# Patient Record
Sex: Female | Born: 2000 | Race: Black or African American | Hispanic: No | Smoking: Former smoker
Health system: Southern US, Community
[De-identification: ages and names within clinical notes are randomized; demographics above are authoritative.]

## PROBLEM LIST (undated history)

## (undated) DIAGNOSIS — D649 Anemia, unspecified: Secondary | ICD-10-CM

## (undated) HISTORY — DX: Anemia, unspecified: D64.9

## (undated) HISTORY — PX: OTHER SURGICAL HISTORY: SHX169

---
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
## 2022-02-07 ENCOUNTER — Ambulatory Visit: Payer: Medicaid Other

## 2022-02-07 ENCOUNTER — Ambulatory Visit (LOCAL_COMMUNITY_HEALTH_CENTER): Payer: Medicaid Other

## 2022-02-07 DIAGNOSIS — Z3201 Encounter for pregnancy test, result positive: Secondary | ICD-10-CM | POA: Diagnosis not present

## 2022-02-07 LAB — PREGNANCY, URINE: Preg Test, Ur: POSITIVE — AB

## 2022-02-07 NOTE — Progress Notes (Signed)
Patient did not report to nurse clinic after going to the lab. Nurse checked both small and big waiting room several times.  Confirmed with Lab she was seen and obtained results of Positive Pregnancy Test.   Attempted to call patient by phone x2.  Information booth attempted to call by phone as well.   No answer.   Also overhead page patient, and patient did not report to clinic.     Jettson Crable Sherrilyn Rist, RN

## 2022-02-25 ENCOUNTER — Other Ambulatory Visit: Payer: Self-pay

## 2022-02-25 ENCOUNTER — Encounter: Payer: Self-pay | Admitting: Advanced Practice Midwife

## 2022-02-25 ENCOUNTER — Ambulatory Visit: Payer: Medicaid Other | Admitting: Advanced Practice Midwife

## 2022-02-25 VITALS — BP 105/74 | HR 98 | Temp 97.6°F | Wt 159.4 lb

## 2022-02-25 DIAGNOSIS — U071 COVID-19: Secondary | ICD-10-CM | POA: Insufficient documentation

## 2022-02-25 DIAGNOSIS — A599 Trichomoniasis, unspecified: Secondary | ICD-10-CM | POA: Insufficient documentation

## 2022-02-25 DIAGNOSIS — F1290 Cannabis use, unspecified, uncomplicated: Secondary | ICD-10-CM

## 2022-02-25 DIAGNOSIS — Z720 Tobacco use: Secondary | ICD-10-CM | POA: Insufficient documentation

## 2022-02-25 DIAGNOSIS — O0930 Supervision of pregnancy with insufficient antenatal care, unspecified trimester: Secondary | ICD-10-CM

## 2022-02-25 DIAGNOSIS — Z3402 Encounter for supervision of normal first pregnancy, second trimester: Secondary | ICD-10-CM | POA: Insufficient documentation

## 2022-02-25 DIAGNOSIS — O99312 Alcohol use complicating pregnancy, second trimester: Secondary | ICD-10-CM | POA: Diagnosis not present

## 2022-02-25 DIAGNOSIS — O0932 Supervision of pregnancy with insufficient antenatal care, second trimester: Secondary | ICD-10-CM | POA: Diagnosis not present

## 2022-02-25 DIAGNOSIS — O99310 Alcohol use complicating pregnancy, unspecified trimester: Secondary | ICD-10-CM | POA: Insufficient documentation

## 2022-02-25 HISTORY — DX: Alcohol use complicating pregnancy, unspecified trimester: O99.310

## 2022-02-25 HISTORY — DX: Trichomoniasis, unspecified: A59.9

## 2022-02-25 HISTORY — DX: Encounter for supervision of normal first pregnancy, second trimester: Z34.02

## 2022-02-25 HISTORY — DX: Cannabis use, unspecified, uncomplicated: F12.90

## 2022-02-25 HISTORY — DX: Supervision of pregnancy with insufficient antenatal care, unspecified trimester: O09.30

## 2022-02-25 HISTORY — DX: Tobacco use: Z72.0

## 2022-02-25 LAB — URINALYSIS
Bilirubin, UA: NEGATIVE
Glucose, UA: NEGATIVE
Ketones, UA: NEGATIVE
Nitrite, UA: NEGATIVE
Specific Gravity, UA: 1.025 (ref 1.005–1.030)
Urobilinogen, Ur: 0.2 mg/dL (ref 0.2–1.0)
pH, UA: 6.5 (ref 5.0–7.5)

## 2022-02-25 LAB — WET PREP FOR TRICH, YEAST, CLUE
Trichomonas Exam: POSITIVE — AB
Yeast Exam: NEGATIVE

## 2022-02-25 LAB — HEMOGLOBIN, FINGERSTICK: Hemoglobin: 11.2 g/dL (ref 11.1–15.9)

## 2022-02-25 MED ORDER — METRONIDAZOLE 500 MG PO TABS: 500.0000 mg | ORAL_TABLET | Freq: Two times a day (BID) | ORAL | 0 refills | Status: AC

## 2022-02-25 MED ORDER — PRENATAL VITAMIN 27-0.8 MG PO TABS: 1.0000 | ORAL_TABLET | Freq: Every day | ORAL | 0 refills | Status: DC

## 2022-02-26 LAB — 789231 7+OXYCODONE-BUND
Amphetamines, Urine: NEGATIVE ng/mL
BENZODIAZ UR QL: NEGATIVE ng/mL
Barbiturate screen, urine: NEGATIVE ng/mL
Cannabinoid Quant, Ur: NEGATIVE ng/mL
Cocaine (Metab.): NEGATIVE ng/mL
OPIATE SCREEN URINE: NEGATIVE ng/mL
Oxycodone/Oxymorphone, Urine: NEGATIVE ng/mL
PCP Quant, Ur: NEGATIVE ng/mL

## 2022-02-27 ENCOUNTER — Other Ambulatory Visit: Payer: Medicaid Other

## 2022-02-27 DIAGNOSIS — Z3402 Encounter for supervision of normal first pregnancy, second trimester: Secondary | ICD-10-CM

## 2022-02-27 LAB — CHLAMYDIA/GC NAA, CONFIRMATION
Chlamydia trachomatis, NAA: NEGATIVE
Neisseria gonorrhoeae, NAA: NEGATIVE

## 2022-02-27 LAB — URINE CULTURE

## 2022-02-27 NOTE — Progress Notes (Signed)
In Nurse Clinic for 1 hr GTT. Drank glucose drink in less than one minute and finished bottle at 8:30 am. Instructions given for test today and to remain npo til lab draw at 9:30 am. Advised to notify RN if n/v. Lab notified that lab draw to take place at 9:30 am.   Pt declines Tdap today.   Pt's mother asks about U/S referral. RN spoke with [facility]  RN Marijo Conception, RN)  who confirms that [facility] has received fax for U/S referral and that pt should be expecting call from [facility] to schedule. RN explained this to pt and her mother. Questions answered and reports understanding. Next [facility] RV appt 03/11/22 arrival 10 am, pt aware. Jerel Shepherd, RN

## 2022-02-28 LAB — GLUCOSE, 1 HOUR GESTATIONAL: Gestational Diabetes Screen: 114 mg/dL (ref 70–139)

## 2022-03-02 LAB — CBC/D/PLT+RPR+RH+ABO+AB SCR
Antibody Screen: NEGATIVE
Basophils Absolute: 0 10*3/uL (ref 0.0–0.2)
Basos: 0 %
EOS (ABSOLUTE): 0.1 10*3/uL (ref 0.0–0.4)
Eos: 1 %
Hematocrit: 35.1 % (ref 34.0–46.6)
Hemoglobin: 11.2 g/dL (ref 11.1–15.9)
Hepatitis B Surface Ag: NEGATIVE
Immature Grans (Abs): 0.1 10*3/uL (ref 0.0–0.1)
Immature Granulocytes: 1 %
Lymphocytes Absolute: 2.2 10*3/uL (ref 0.7–3.1)
Lymphs: 23 %
MCH: 23.1 pg — ABNORMAL LOW (ref 26.6–33.0)
MCHC: 31.9 g/dL (ref 31.5–35.7)
MCV: 73 fL — ABNORMAL LOW (ref 79–97)
Monocytes Absolute: 0.8 10*3/uL (ref 0.1–0.9)
Monocytes: 9 %
Neutrophils Absolute: 6.2 10*3/uL (ref 1.4–7.0)
Neutrophils: 66 %
Platelets: 324 10*3/uL (ref 150–450)
RBC: 4.84 x10E6/uL (ref 3.77–5.28)
RDW: 14.7 % (ref 11.7–15.4)
RPR Ser Ql: NONREACTIVE
Rh Factor: POSITIVE
WBC: 9.4 10*3/uL (ref 3.4–10.8)

## 2022-03-02 LAB — MATERNIT 21 PLUS CORE, BLOOD
Fetal Fraction: 10
Result (T21): NEGATIVE
Trisomy 13 (Patau syndrome): NEGATIVE
Trisomy 18 (Edwards syndrome): NEGATIVE
Trisomy 21 (Down syndrome): NEGATIVE

## 2022-03-02 LAB — HGB FRACTIONATION CASCADE
Hgb A2: 2.3 % (ref 1.8–3.2)
Hgb A: 97.7 % (ref 96.4–98.8)
Hgb F: 0 % (ref 0.0–2.0)
Hgb S: 0 %

## 2022-03-02 LAB — HCV AB W REFLEX TO QUANT PCR: HCV Ab: NONREACTIVE

## 2022-03-02 LAB — HIV-1/HIV-2 QUALITATIVE RNA
HIV-1 RNA, Qualitative: NONREACTIVE
HIV-2 RNA, Qualitative: NONREACTIVE

## 2022-03-02 LAB — HCV INTERPRETATION

## 2022-03-11 ENCOUNTER — Ambulatory Visit: Payer: Medicaid Other | Admitting: Advanced Practice Midwife

## 2022-03-11 DIAGNOSIS — Z720 Tobacco use: Secondary | ICD-10-CM

## 2022-03-11 DIAGNOSIS — O0932 Supervision of pregnancy with insufficient antenatal care, second trimester: Secondary | ICD-10-CM

## 2022-03-11 DIAGNOSIS — O2340 Unspecified infection of urinary tract in pregnancy, unspecified trimester: Secondary | ICD-10-CM

## 2022-03-11 DIAGNOSIS — O2342 Unspecified infection of urinary tract in pregnancy, second trimester: Secondary | ICD-10-CM | POA: Diagnosis not present

## 2022-03-11 DIAGNOSIS — A599 Trichomoniasis, unspecified: Secondary | ICD-10-CM

## 2022-03-11 DIAGNOSIS — O99310 Alcohol use complicating pregnancy, unspecified trimester: Secondary | ICD-10-CM

## 2022-03-11 DIAGNOSIS — Z789 Other specified health status: Secondary | ICD-10-CM | POA: Insufficient documentation

## 2022-03-11 DIAGNOSIS — Z3402 Encounter for supervision of normal first pregnancy, second trimester: Secondary | ICD-10-CM | POA: Diagnosis not present

## 2022-03-11 DIAGNOSIS — O0930 Supervision of pregnancy with insufficient antenatal care, unspecified trimester: Secondary | ICD-10-CM

## 2022-03-11 HISTORY — DX: Unspecified infection of urinary tract in pregnancy, unspecified trimester: O23.40

## 2022-03-11 HISTORY — DX: Other specified health status: Z78.9

## 2022-03-11 NOTE — Progress Notes (Signed)
Patient states completed Keflex course, denies intercourse since treated. Pediatrician-undecided. Pediatrician List given.  Reminded of U/S appointment 07/26. Declined Tdap vaccine today.Delynn Flavin RN

## 2022-03-11 NOTE — Progress Notes (Signed)
[facility]  PRENATAL VISIT NOTE  Subjective:  Paula Case is a 21 y.o. G1P0 at [redacted]w[redacted]d being seen today for ongoing prenatal care.  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  Patient reports no complaints.  Contractions: Not present. Vag. Bleeding: None.  Movement: Present. Denies leaking of fluid/ROM.   The following portions of the patient's history were reviewed and updated as appropriate: allergies, current medications, past family history, past medical history, past social history, past surgical history and problem list. Problem list updated.  Objective:  There were no vitals filed for this visit.  Fetal Status: Fetal Heart Rate (bpm): 150 Fundal Height: 37 cm Movement: Present  Presentation: Vertex  General:  Alert, oriented and cooperative. Patient is in no acute distress.  Skin: Skin is warm and dry. No rash noted.   Cardiovascular: Normal heart rate noted  Respiratory: Normal respiratory effort, no problems with respiration noted  Abdomen: Soft, gravid, appropriate for gestational age.  Pain/Pressure: Absent     Pelvic: Cervical exam deferred        Extremities: Normal range of motion.  Edema: None  Mental Status: Normal mood and affect. Normal behavior. Normal judgment and thought content.   Assessment and Plan:  Pregnancy: G1P0 at [redacted]w[redacted]d  1. Encounter for supervision of normal first pregnancy in second trimester Has dating u/s 03/27/22 followed by genetic counseling apt--informed both pt and her mom of this 02/25/22  NIPS=neg 02/25/22 UDS=neg Not working Walking 7x/wk x 4 min--counseled to increase to 20 min Mom present in exam room Reviewed [facility] bedside ER u/s on 03/03/22 with gestational age not in report but FHR=146 but they suspect 30-[redacted] wks pregnant Pt told [facility] that she doesn't have an apt with [facility] for 2 months RTC 1 week - Urine Culture & Sensitivity  2. Trich 02/25/22 Treated on 02/25/22  3. Urinary tract infection in mother during pregnancy, antepartum dx'd [facility] 03/03/22 Given Keflex 500 mg QID x 7 days which pt states she completed C&S TOC today  - Urine Culture & Sensitivity   4. Maternal alcohol use complicating pregnancy, antepartum Pt denies use since 01/26/22  5. Late prenatal care 15 2/7   6. 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 Poor historian When asked if pt had been to ER/urgent care/dx'd with any other infection outside of this building and if she had any u/s this pregnancy yet, pt stated no Her mom later remembered that she took her to [facility] for pain on 03/03/22 and they told her to go to [facility] where she was dx'd with UTI and given Keflex and a bedside u/s    Preterm labor symptoms and general obstetric precautions including but not limited to vaginal bleeding, contractions, leaking of fluid and fetal movement were reviewed in detail with the patient. Please refer to After Visit Summary for other counseling recommendations.  Return in about 1 week (around 03/18/2022) for routine PNC.  Future Appointments  Date Time Provider Department Center  03/21/2022  8:20 AM [facility] PROVIDER [facility] None    Alberteen Spindle, CNM

## 2022-03-13 LAB — URINE CULTURE

## 2022-03-14 NOTE — Addendum Note (Signed)
Addended by: Heywood Bene on: 03/14/2022 12:28 PM   Modules accepted: Orders

## 2022-03-21 ENCOUNTER — Telehealth: Payer: Self-pay

## 2022-03-21 ENCOUNTER — Ambulatory Visit: Payer: Medicaid Other

## 2022-03-21 NOTE — Telephone Encounter (Signed)
Telephone call to patient to reschedule her [facility] MH RV 03-21-22.  Voicemail mailbox is full.  No message could be left.  Hart Carwin, RN

## 2022-03-21 NOTE — Telephone Encounter (Signed)
Call to patient due to missed appt for MH RV on 03/21/22 at 0800. No answer and phone mail box is not available to leave VM.   Earlyne Iba, RN

## 2022-03-22 NOTE — Telephone Encounter (Signed)
Telephone call to patient emergency contact/Beverly Raul Del (Grandmother) [phone] regarding help to reach patient to reschedule her MH RV appointment.  Left message to have patient call our office at [phone] to reschedule her appointment.  Also left message for patient to return our call at [phone].  Hart Carwin, RN

## 2022-03-22 NOTE — Telephone Encounter (Signed)
Return call by patient contact (Grandmother).  She will call her daughter now and have her call the patient to have her call and reschedule the appointment.  Hart Carwin, RN

## 2022-03-25 NOTE — Telephone Encounter (Signed)
Telephone call to patient today at number on file and someone picked up, but would not say a word.  Finally call was disconnected.  Hart Carwin, RN

## 2022-03-26 NOTE — Telephone Encounter (Signed)
Telephone call to Mother Leodis Rains) today. She will have patient give [place] a call back to reschedule her appointment. for [facility] RV  [phone].  Hart Carwin, RN

## 2022-03-28 NOTE — Telephone Encounter (Signed)
Mother of patient wanted appointment scheduled on a Monday early as possible since she will be the one to bring patient to her appointment and it is her day off.  MH RV scheduled for Monday 04-08-22 at 10:40 (arrival time 10:30).  Mother of patient asked me to text he the appointment via cell phone number on file.  Information sent via text as requested.  Earlyne Iba, RN assisted with appointment due to schedule needs.  Hart Carwin, RN

## 2022-03-28 NOTE — Telephone Encounter (Signed)
Telephone call to patient today to reschedule her MH RV.  Call dropped before it was answered. Hart Carwin, RN

## 2022-04-08 ENCOUNTER — Telehealth: Payer: Self-pay

## 2022-04-08 ENCOUNTER — Ambulatory Visit: Payer: Medicaid Other

## 2022-04-08 NOTE — Telephone Encounter (Signed)
Patient was scheduled for MH RV today. Arrived and decided to reschedule her appointment. Now scheduled for 04/15/2022. Patient is 16 2/7 by her 03/27/22 U/S. Patient needs to be seen this week for MH RV. TC to patient phone number and first time, could not be completed, next call, mailbox full. Unable to LM.Marland KitchenBurt Knack, RN

## 2022-04-09 NOTE — Telephone Encounter (Signed)
Telephone call to patient this morning to offer her a morning appointment due to some openings in [facility] today.  Mailbox full.  Hart Carwin, RN

## 2022-04-10 NOTE — Telephone Encounter (Signed)
Per Epic appt desk, client has [facility] RV appt scheduled for 04/15/2022. Jossie Ng, RN

## 2022-04-10 NOTE — Telephone Encounter (Signed)
Telephone call to patient today.  Mailbox is full. Hart Carwin, RN

## 2022-04-15 ENCOUNTER — Encounter: Payer: Self-pay | Admitting: Advanced Practice Midwife

## 2022-04-15 ENCOUNTER — Ambulatory Visit: Payer: Medicaid Other

## 2022-05-20 ENCOUNTER — Telehealth: Payer: Self-pay | Admitting: Family Medicine

## 2022-05-20 NOTE — Telephone Encounter (Signed)
Scheduled patients Post Partum appointment on 06/03/2022

## 2022-06-03 ENCOUNTER — Encounter: Payer: Self-pay | Admitting: Advanced Practice Midwife

## 2022-06-03 ENCOUNTER — Ambulatory Visit: Payer: Medicaid Other | Admitting: Advanced Practice Midwife

## 2022-06-03 VITALS — BP 106/75 | HR 87 | Temp 97.8°F | Ht 61.0 in | Wt 160.4 lb

## 2022-06-03 DIAGNOSIS — Z5321 Procedure and treatment not carried out due to patient leaving prior to being seen by health care provider: Secondary | ICD-10-CM

## 2022-06-03 NOTE — Progress Notes (Addendum)
Per [facility], received Depo 04/19/2022 prior to discharge. Next Depo due 07/05/2022 and client has due date reminder card / appt line number. Counseled date on card not an appt - will need to call appt line. Rich Number, RN Client leaving [facility] as mother has to go to work and she needs to take infant to her father on [place]. Per client, she will be back shortly. Counseled that we would have to work her in as other clients scheduled this pm. Client states it will not take her very long. Rich Number, RN Client did not return for provider portion of post-partum exam and K. Brewer-Jensen RN, [facility] Cordinator aware. Rich Number, RN

## 2022-06-04 ENCOUNTER — Telehealth: Payer: Self-pay

## 2022-06-04 NOTE — Telephone Encounter (Signed)
Presented for post-partum appt 06/03/2022 and RN portion of visit completed including paperwork (Depo consent signed). Received Depo 04/19/1022 while at [facility] post-partum. Client left appt to take infant to her father as mom needed to go to work (in car with infant at appt per client). Per client, planned to return to complete appt as only going to [facility]. Client did not return for appt.  Call to number for client and her mother answered stating she would have client call us back. Counseled calling to reschedule her post-partum appt. Rich Number, RN

## 2022-06-06 NOTE — Telephone Encounter (Signed)
TC to patient to reschedule PP appointment. Patient was here the other day and did not stay to see provider. Unable to LM, VM is full.Jenetta Downer, RN

## 2022-06-10 NOTE — Telephone Encounter (Signed)
TC to patient to try to schedule her PP appointment provider part. Unable to LM.Jenetta Downer, RN

## 2022-06-12 NOTE — Telephone Encounter (Signed)
TC to patient to reschedule PP appointment, unable to LM, VM box is full.Jenetta Downer, RN

## 2022-06-19 NOTE — Telephone Encounter (Signed)
Call to client to reschedule post-partum appt as left prior to provider portion of exam. Call to client's cell (same number as emergency contact - mother) and per recorded message, voicemail box is full. Call to emergency contact (grandmother) who states will get message to client to call for appt. Rich Number, RN

## 2022-07-04 ENCOUNTER — Telehealth: Payer: Self-pay | Admitting: Family Medicine

## 2022-07-04 NOTE — Telephone Encounter (Signed)
PLEASE CALL ME I HAVE QUESTIONS ABOUT MY BIRTH CONTROL

## 2022-07-05 NOTE — Telephone Encounter (Signed)
T/C to pt. She came for pp exam on 06/03/22 but left before being seen by provider and hasn't come back. States she has an apt next week for her pp apt and to get more DMPA. Last DMPA at [facility] after birth 04/19/22 and c/o bleeding since but it just stopped. Questions answered and pt plans to come in for pp exam and more DMPA next week.

## 2022-07-16 ENCOUNTER — Ambulatory Visit: Payer: Medicaid Other

## 2023-02-06 ENCOUNTER — Ambulatory Visit: Payer: Medicaid Other

## 2024-04-22 ENCOUNTER — Ambulatory Visit

## 2024-08-31 ENCOUNTER — Ambulatory Visit

## 2024-08-31 VITALS — BP 127/76 | Ht 61.0 in | Wt 175.5 lb

## 2024-08-31 DIAGNOSIS — Z3201 Encounter for pregnancy test, result positive: Secondary | ICD-10-CM | POA: Diagnosis not present

## 2024-08-31 DIAGNOSIS — Z309 Encounter for contraceptive management, unspecified: Secondary | ICD-10-CM

## 2024-08-31 LAB — PREGNANCY, URINE: Preg Test, Ur: POSITIVE — AB

## 2024-08-31 MED ORDER — PRENATAL 27-0.8 MG PO TABS: 1.0000 | ORAL_TABLET | Freq: Every day | ORAL | Status: AC

## 2024-08-31 NOTE — Progress Notes (Addendum)
 UPT positive . Pt states she is not feeling any fetal movement and intermittent pain in her pelvic area at times. Pt is 19wks 4days along . Discussed with patient if experiencing severe pain that does not go away and/or bleeding needs to go to ED for eval.  Consulted with Provider-C. Macario, MD recommended prenatal ASAP.  Plans on prenatal care [facility]. Sent to clerical for presumptive eligibility.   Pregnancy packet given and reviewed information in packet.  The patient was dispensed PNV #[id number] today. I provided counseling today regarding the medication. We discussed the medication, the side effects and when to call clinic. Patient given the opportunity to ask questions. Questions answered.

## 2024-09-14 ENCOUNTER — Ambulatory Visit

## 2024-09-14 VITALS — BP 114/75 | HR 98 | Temp 98.6°F | Wt 174.2 lb

## 2024-09-14 DIAGNOSIS — Z3492 Encounter for supervision of normal pregnancy, unspecified, second trimester: Secondary | ICD-10-CM | POA: Diagnosis not present

## 2024-09-14 DIAGNOSIS — Z98891 History of uterine scar from previous surgery: Secondary | ICD-10-CM | POA: Diagnosis not present

## 2024-09-14 DIAGNOSIS — O0932 Supervision of pregnancy with insufficient antenatal care, second trimester: Secondary | ICD-10-CM | POA: Diagnosis not present

## 2024-09-14 DIAGNOSIS — Z3A22 22 weeks gestation of pregnancy: Secondary | ICD-10-CM | POA: Diagnosis not present

## 2024-09-14 DIAGNOSIS — Z3490 Encounter for supervision of normal pregnancy, unspecified, unspecified trimester: Secondary | ICD-10-CM | POA: Insufficient documentation

## 2024-09-14 DIAGNOSIS — O0930 Supervision of pregnancy with insufficient antenatal care, unspecified trimester: Secondary | ICD-10-CM

## 2024-09-14 LAB — HEMOGLOBIN, FINGERSTICK: Hemoglobin: 11.4 g/dL (ref 11.1–15.9)

## 2024-09-14 NOTE — Progress Notes (Addendum)
 " [facility] [address] [place] [address] Main phone: [phone]  Initial Prenatal Visit  Subjective:  Paula Case is a 24 y.o. G2P1001 at [redacted]w[redacted]d being seen today to start prenatal care at the [facility]. The following medical issues will be considered in the care of this low-risk pregnancy:   Patient Active Problem List   Diagnosis Date Noted   History of cesarean section 09/15/2024   Encounter for supervision of normal pregnancy, antepartum 09/14/2024   Patient reports no complaints.  Contractions: Not present. Vag. Bleeding: None.  Movement: Present. Denies leaking of fluid.   Symptom review and family concerns Any questions or concerns today: No Nausea or vomiting: No Pelvic pain: lower abdominal cramping only with position changes Vaginal bleeding: No How do you feel about being pregnant: been crying a lot but happy about pregnancy. Was pregnancy planned: Not planned.  Dating LMP: 04/09/24 was a really short period per pt report.  Reliable? No Any US  performed already? No  Past history   Surgical history: C-Section due to .non-reassuring fetal status  Tobacco: No ETOH: No Drugs: No  Indications for ASA therapy One of the following: Previous pregnancy with preeclampsia, especially early onset and with an adverse outcome No  Multifetal gestation No  Chronic hypertension No  Type 1 or 2 diabetes mellitus No  Chronic kidney disease No  Autoimmune disease (antiphospholipid syndrome, systemic lupus erythematosus) No   Two or more of the following: Nulliparity  No  Obesity (body mass index >30 kg/m2) No  Family history of preeclampsia in mother or sister No  Age >=35 years No  Sociodemographic characteristics (African American race, low socioeconomic level) Yes  Personal risk factors (eg, previous pregnancy with low birth weight or small for gestational age  infant, previous adverse pregnancy outcome [eg, stillbirth], interval >10 years between pregnancies) No    The following portions of the patient's history were reviewed and updated as appropriate: allergies, current medications, past family history, past medical history, past social history, past surgical history and problem list. Problem list updated.  Objective:   Vitals:   09/14/24 1332  BP: 114/75  Pulse: 98  Temp: 98.6 F (37 C)  Weight: 174 lb 3.2 oz (79 kg)   Fetal Status: Fetal Heart Rate (bpm): 150 Fundal Height: 24 cm Movement: Present      Physical Exam Vitals and nursing note reviewed. Exam conducted with a chaperone present [name], RN).  Constitutional:      General: She is not in acute distress.    Appearance: Normal appearance. She is well-developed.  HENT:     Head: Normocephalic and atraumatic.     Right Ear: External ear normal.     Left Ear: External ear normal.     Nose: Nose normal. No congestion or rhinorrhea.     Mouth/Throat:     Lips: Pink.     Mouth: Mucous membranes are moist.     Dentition: Normal dentition. No dental caries.     Pharynx: Oropharynx is clear. Uvula midline.     Comments: Dentition: No missing teeth, no caries Eyes:     General: No scleral icterus.    Conjunctiva/sclera: Conjunctivae normal.     Pupils: Pupils are equal, round, and reactive to light.  Neck:     Thyroid: No thyroid mass or thyromegaly.  Cardiovascular:     Rate and Rhythm: Normal rate and regular rhythm.  Pulses: Normal pulses.     Heart sounds: Normal heart sounds.     Comments: Extremities are warm and well perfused Pulmonary:     Effort: Pulmonary effort is normal.     Breath sounds: Normal breath sounds.  Chest:     Chest wall: No mass.  Breasts:    Tanner Score is 5.     Breasts are symmetrical.     Right: Normal. No mass, nipple discharge or skin change.     Left: Normal. No mass, nipple discharge or skin change.  Abdominal:      General: Abdomen is flat.     Palpations: Abdomen is soft.     Tenderness: There is no abdominal tenderness.     Comments: Gravid   Genitourinary:    Comments: Declined pelvic examination  Musculoskeletal:        General: Normal range of motion.     Cervical back: Normal range of motion.     Right lower leg: No edema.     Left lower leg: No edema.  Lymphadenopathy:     Cervical: No cervical adenopathy.     Upper Body:     Right upper body: No supraclavicular or axillary adenopathy.     Left upper body: No supraclavicular or axillary adenopathy.  Skin:    General: Skin is warm.     Capillary Refill: Capillary refill takes less than 2 seconds.  Neurological:     General: No focal deficit present.     Mental Status: She is alert and oriented to person, place, and time. Mental status is at baseline.  Psychiatric:        Mood and Affect: Mood normal.        Behavior: Behavior normal.        Thought Content: Thought content normal.        Judgment: Judgment normal.     Assessment and Plan:  Pregnancy: G2P1001 at [redacted]w[redacted]d  1. [redacted] weeks gestation of pregnancy (Primary) -RV in 4 weeks.   2. Encounter for supervision of normal pregnancy, antepartum, unspecified gravidity  -Reviewed recommended weight gain: 15-25 lbs. Discussed healthy diet (avoiding soda, good sources of protein, fruits, and vegetables, lots of water) and exercising. -Denies n/v -Pap due today and collected; pt defers pap today desires to get pap completed postpartum -Last dental visit 12th grade. Encouraged to seek dental care this pregnancy. -Pap smear: Due, pt deferred untl postpartum -Referral placed via [facility] for Anatomy US .  -ASA therapy not indicated. -EPDS:7, CMHRP form completed no concerns. -She lives with mom, and her support person is mother.  - Prenatal Profile I - AFP, Serum, Open Spina Bifida - Inheritest 14-Gene Panel - MaterniT21 PLUS Core+ESS+SCA - Hemoglobin, fingerstick  3. History  of cesarean section -Pt desires repeat C/S with this pregnancy.    4. Late prenatal care - Reviewed with pt benefits of regular moderate intensity exercise 20-30 minutes per day and a balance diet to support healthy pregnancy weight gain.   Discussed overview of care and coordination with inpatient delivery practices including [facility],  [facility].   Reviewed Centering pregnancy as standard of care at [facility]. [facility] Cent[facility].  Preterm labor symptoms and general obstetric precautions including but not limited to vaginal bleeding, contractions, leaking of fluid and fetal movement were reviewed in detail with the patient.  Please refer to After Visit Summary for other counseling recommendations.   Return in about 4 weeks (around 10/12/2024) for routine prenatal  care.  Future Appointments  Date Time Provider Department Center  09/23/2024  1:50 PM [facility] PROVIDER [facility] None    Deardra Hinkley GORMAN Pouch, NP "

## 2024-09-14 NOTE — Progress Notes (Unsigned)
 Presents for initiation of prenatal care and denies medical care thus far in pregnancy. Reports taking prenatal vitamin (dispensed by [facility]) BID. Counseled to only take one vitamin per day and understanding verbalized. Desires NIPS, AFP and carrier screening testing today. Counseled on recommendation for flu vaccine in pregnancy and vaccine declined today - VIS given. Covid vaccine declined (no history of vaccine). Left arm circumference measured at 34 cm and adult BP cuff needed at this time. Burnadette Lowers, RN Hgb = 11.4 and no intervention required per standing order. Burnadette Lowers, RN

## 2024-09-15 DIAGNOSIS — Z98891 History of uterine scar from previous surgery: Secondary | ICD-10-CM | POA: Insufficient documentation

## 2024-09-22 ENCOUNTER — Telehealth: Payer: Self-pay | Admitting: Family Medicine

## 2024-09-23 ENCOUNTER — Encounter: Payer: Self-pay | Admitting: Physician Assistant

## 2024-09-23 ENCOUNTER — Ambulatory Visit: Admitting: Physician Assistant

## 2024-09-23 VITALS — BP 125/78 | Wt 179.4 lb

## 2024-09-23 DIAGNOSIS — Z3490 Encounter for supervision of normal pregnancy, unspecified, unspecified trimester: Secondary | ICD-10-CM

## 2024-09-23 DIAGNOSIS — Z3A23 23 weeks gestation of pregnancy: Secondary | ICD-10-CM

## 2024-09-23 DIAGNOSIS — Z3492 Encounter for supervision of normal pregnancy, unspecified, second trimester: Secondary | ICD-10-CM

## 2024-09-23 NOTE — Progress Notes (Signed)
" °  [facility] [address], [address] [place] [address] Main phone: [phone]  Prenatal Visit  Subjective:  Paula Case is a 24 y.o. G2P1001 at [redacted]w[redacted]d being seen today for ongoing prenatal care.  She is currently monitored for the following issues for this low-risk pregnancy:   Patient Active Problem List   Diagnosis Date Noted   History of cesarean section 09/15/2024   Encounter for supervision of normal pregnancy, antepartum 09/14/2024   HPI Patient reports swelling in feet that comes and goes, goes down when she rest. She reports she is on her feet standing a lot for long hours..Pt Denies leaking of fluid/ROM.   The following portions of the patient's history were reviewed and updated as appropriate: allergies, current medications, past family history, past medical history, past social history, past surgical history and problem list. Problem list updated.  Objective:   Vitals:   09/23/24 1319  BP: 125/78  Weight: 179 lb 6.4 oz (81.4 kg)   Total weight gain from pre-pregnancy weight: 24 lb 6.4 oz (11.1 kg)  Fetal Status: Fetal Heart Rate (bpm): 154 Fundal Height: 25 cm Movement: Present    Fundal height trends reviewed - appropriate for EGA  General:  Alert, oriented and cooperative. Patient is in no acute distress.  Skin: Skin is warm and dry. No rash noted.   Cardiovascular: Normal heart rate noted  Respiratory: Normal respiratory effort, no problems with respiration noted  Abdomen: Soft, gravid, appropriate for gestational age.  Pain/Pressure: Absent     Pelvic: Cervical exam deferred        Extremities: Normal range of motion.  Edema: Trace  Mental Status: Normal mood and affect. Normal behavior. Normal judgment and thought content.   Assessment and Plan:  Pregnancy: G2P1001 at [redacted]w[redacted]d  1. [redacted] weeks gestation of pregnancy (Primary) -RV in 4 weeks.   2. Encounter for supervision of normal  pregnancy, antepartum, unspecified gravidity -TWG: 24 lb 6.4 oz (11.1 kg)  Has gained 5  lbs in 2 weeks.   -Continues to take PNV without concerns.  -Enc. Pt to keep US  appt on 10/13/24, pt reports she will continue to call them to see if she can have her US  sooner.  -Provided gender of baby in envelope today in clinic to pt.  - Fetal movement noted, fundal height appropriate for GA.  - Discussed wearing compression stockings during long periods of standing, propping feet up at night with pillows. -Rec patient continue to stay adequately hydrated with a diet low on salt/sugar. -Centering Pregnancy, Session#1: Introduction to model of care. Group determined rules for self-governance and closing phrase. Oriented group to space and mother's notebook.   Facilitated discussion today:  Introductions and conducted pregnancy do's/don'ts activity.  Fundal height and FHR appropriate today unless noted otherwise in plan. Patient to continue group care   Preterm labor symptoms and general obstetric precautions including but not limited to vaginal bleeding, contractions, leaking of fluid and fetal movement were reviewed in detail with the patient. Please refer to After Visit Summary for other counseling recommendations.  No follow-ups on file.  No future appointments.   Hardin GORMAN Pouch, NP "

## 2024-09-23 NOTE — Progress Notes (Signed)
 Here for Centering appointment at 23 6/7. States has U/S at [facility] 10/13/24.  Declines flu vaccine. S/S PTL reviewed and literature given.Izetta Parish, RN

## 2024-09-30 ENCOUNTER — Ambulatory Visit: Payer: Self-pay | Admitting: Family Medicine

## 2024-09-30 DIAGNOSIS — O0930 Supervision of pregnancy with insufficient antenatal care, unspecified trimester: Secondary | ICD-10-CM

## 2024-09-30 DIAGNOSIS — Z3490 Encounter for supervision of normal pregnancy, unspecified, unspecified trimester: Secondary | ICD-10-CM

## 2024-10-05 ENCOUNTER — Telehealth: Payer: Self-pay

## 2024-10-05 DIAGNOSIS — D563 Thalassemia minor: Secondary | ICD-10-CM | POA: Insufficient documentation

## 2024-10-05 NOTE — Telephone Encounter (Signed)
 Hgb WNL, Carrier screen postive Alpha Thalaassemia will discuss at RV and offer Genetic Counseling. Urine Culture Escherichia Coli - 10,000--25,000 colony forming units - lactobacillus species Repeat Culture RV. Hep B negative, HCV non-reactive, RPR Non-reactive, Rubella Immune, Antibody screen negative, HIV non reactive, GC/CC negative, MaterniT21- Negative.   All above results reviewed, no further action indicated at this time.   RV will repeat Urine Culture and offer Genetic counseling services at RV.  Hardin Pouch, [facility]

## 2024-10-06 DIAGNOSIS — O0930 Supervision of pregnancy with insufficient antenatal care, unspecified trimester: Secondary | ICD-10-CM | POA: Insufficient documentation

## 2024-10-06 LAB — MATERNIT21  PLUS CORE+ESS+SCA, BLOOD
11q23 deletion (Jacobsen): NOT DETECTED
15q11 deletion (PW Angelman): NOT DETECTED
1p36 deletion syndrome: NOT DETECTED
22q11 deletion (DiGeorge): NOT DETECTED
4p16 deletion(Wolf-Hirschhorn): NOT DETECTED
5p15 deletion (Cri-du-chat): NOT DETECTED
8q24 deletion (Langer-Giedion): NOT DETECTED
Fetal Fraction: 18
Monosomy X (Turner Syndrome): NOT DETECTED
Result (T21): NEGATIVE
Trisomy 13 (Patau syndrome): NEGATIVE
Trisomy 16: NOT DETECTED
Trisomy 18 (Edwards syndrome): NEGATIVE
Trisomy 21 (Down syndrome): NEGATIVE
Trisomy 22: NOT DETECTED
XXX (Triple X Syndrome): NOT DETECTED
XXY (Klinefelter Syndrome): NOT DETECTED
XYY (Jacobs Syndrome): NOT DETECTED

## 2024-10-06 LAB — MICROSCOPIC EXAMINATION
Bacteria, UA: NONE SEEN
Casts: NONE SEEN /LPF
Epithelial Cells (non renal): >10 /HPF — AB (ref 0–10)
RBC, Urine: NONE SEEN /HPF (ref 0–2)

## 2024-10-06 LAB — PREGNANCY, INITIAL SCREEN
Antibody Screen: NEGATIVE
Basophils Absolute: 0 10*3/uL (ref 0.0–0.2)
Basos: 0 %
Bilirubin, UA: NEGATIVE
Chlamydia trachomatis, NAA: NEGATIVE
EOS (ABSOLUTE): 0.2 10*3/uL (ref 0.0–0.4)
Eos: 2 %
Glucose, UA: NEGATIVE
HCV Ab: NONREACTIVE
HIV Screen 4th Generation wRfx: NONREACTIVE
Hematocrit: 37.1 % (ref 34.0–46.6)
Hemoglobin: 11.5 g/dL (ref 11.1–15.9)
Hepatitis B Surface Ag: NEGATIVE
Immature Grans (Abs): 0.1 10*3/uL (ref 0.0–0.1)
Immature Granulocytes: 1 %
Ketones, UA: NEGATIVE
Leukocytes,UA: NEGATIVE
Lymphocytes Absolute: 2.6 10*3/uL (ref 0.7–3.1)
Lymphs: 27 %
MCH: 23.3 pg — ABNORMAL LOW (ref 26.6–33.0)
MCHC: 31 g/dL — ABNORMAL LOW (ref 31.5–35.7)
MCV: 75 fL — ABNORMAL LOW (ref 79–97)
Monocytes Absolute: 0.6 10*3/uL (ref 0.1–0.9)
Monocytes: 6 %
Neisseria Gonorrhoeae by PCR: NEGATIVE
Neutrophils Absolute: 6.2 10*3/uL (ref 1.4–7.0)
Neutrophils: 64 %
Nitrite, UA: NEGATIVE
Platelets: 377 10*3/uL (ref 150–450)
Protein,UA: NEGATIVE
RBC, UA: NEGATIVE
RBC: 4.94 x10E6/uL (ref 3.77–5.28)
RDW: 15.3 % (ref 11.7–15.4)
RPR Ser Ql: NONREACTIVE
Rh Factor: POSITIVE
Rubella Antibodies, IGG: 2.08 {index}
Specific Gravity, UA: 1.025 (ref 1.005–1.030)
Urobilinogen, Ur: 1 mg/dL (ref 0.2–1.0)
WBC: 9.7 10*3/uL (ref 3.4–10.8)
pH, UA: 7 (ref 5.0–7.5)

## 2024-10-06 LAB — AFP, SERUM, OPEN SPINA BIFIDA
AFP MoM: 0.86
AFP Value: 65.9 ng/mL
Gest. Age on Collection Date: 22.4 wk
Maternal Age At EDD: 22.1 a
OSBR Risk 1 IN: 10000
Test Results:: NEGATIVE
Weight: 174 [lb_av]

## 2024-10-06 LAB — HCV INTERPRETATION

## 2024-10-06 LAB — URINE CULTURE, OB REFLEX

## 2024-10-06 LAB — BEACON CARRIER SCREEN;14 GENES

## 2024-10-06 NOTE — Telephone Encounter (Signed)
 Called [facility] to provide missing patient info from collection date. Weight on day of collection provided. They will fax over new result once the AFP is calculated.   Dorothyann Helling, MD 10/06/24  10:02 AM

## 2024-10-06 NOTE — Progress Notes (Signed)
 Initial prenatal labs reviewed.  Urine Cx clinically insignificant - will not treat.  Carrier screen (+) for alpha thal (-/-) AFP - needs patient info to calculate, will call [facility]  Problem list updated.  Dorothyann Helling, MD 10/06/24  9:51 AM

## 2024-10-06 NOTE — Progress Notes (Signed)
 AFP negative. Chart updated. To be discussed with patient at next prenatal appointment.   Dorothyann Helling, MD 10/06/24  11:03 AM

## 2024-10-21 ENCOUNTER — Ambulatory Visit

## 2024-11-18 ENCOUNTER — Ambulatory Visit

## 2024-12-02 ENCOUNTER — Ambulatory Visit

## 2024-12-16 ENCOUNTER — Ambulatory Visit

## 2024-12-30 ENCOUNTER — Ambulatory Visit

## 2025-01-13 ENCOUNTER — Ambulatory Visit

## 2025-01-27 ENCOUNTER — Ambulatory Visit

## 2025-02-10 ENCOUNTER — Ambulatory Visit
